# Patient Record
Sex: Male | Born: 1975 | Race: White | Hispanic: No | Marital: Single | Smoking: Never smoker
Health system: Southern US, Community
[De-identification: ages and names within clinical notes are randomized; demographics above are authoritative.]

## PROBLEM LIST (undated history)

## (undated) DIAGNOSIS — I10 Essential (primary) hypertension: Secondary | ICD-10-CM

---

## 2002-04-28 ENCOUNTER — Emergency Department (HOSPITAL_COMMUNITY): Admission: EM | Admit: 2002-04-28 | Discharge: 2002-04-28 | Payer: Self-pay | Admitting: Emergency Medicine

## 2002-04-29 ENCOUNTER — Emergency Department (HOSPITAL_COMMUNITY): Admission: EM | Admit: 2002-04-29 | Discharge: 2002-04-29 | Payer: Self-pay | Admitting: Emergency Medicine

## 2013-07-27 ENCOUNTER — Emergency Department (HOSPITAL_COMMUNITY)
Admission: EM | Admit: 2013-07-27 | Discharge: 2013-07-27 | Disposition: A | Discharge status: Home / Self Care | Payer: Worker's Compensation | Attending: Emergency Medicine | Admitting: Emergency Medicine

## 2013-07-27 ENCOUNTER — Encounter (HOSPITAL_COMMUNITY): Payer: Self-pay | Admitting: Emergency Medicine

## 2013-07-27 DIAGNOSIS — Y990 Civilian activity done for income or pay: Secondary | ICD-10-CM | POA: Insufficient documentation

## 2013-07-27 DIAGNOSIS — Y929 Unspecified place or not applicable: Secondary | ICD-10-CM | POA: Insufficient documentation

## 2013-07-27 DIAGNOSIS — W298XXA Contact with other powered powered hand tools and household machinery, initial encounter: Secondary | ICD-10-CM | POA: Insufficient documentation

## 2013-07-27 DIAGNOSIS — S61209A Unspecified open wound of unspecified finger without damage to nail, initial encounter: Secondary | ICD-10-CM | POA: Insufficient documentation

## 2013-07-27 DIAGNOSIS — Z881 Allergy status to other antibiotic agents status: Secondary | ICD-10-CM | POA: Insufficient documentation

## 2013-07-27 DIAGNOSIS — Z23 Encounter for immunization: Secondary | ICD-10-CM | POA: Insufficient documentation

## 2013-07-27 DIAGNOSIS — S61213A Laceration without foreign body of left middle finger without damage to nail, initial encounter: Secondary | ICD-10-CM

## 2013-07-27 MED ORDER — HYDROCODONE-ACETAMINOPHEN 5-325 MG PO TABS: 1.0000 | ORAL_TABLET | Freq: Four times a day (QID) | ORAL | Status: DC | PRN

## 2013-07-27 MED ORDER — HYDROCODONE-ACETAMINOPHEN 5-325 MG PO TABS
2.0000 | ORAL_TABLET | Freq: Once | ORAL | Status: AC
  Administered 2013-07-27: 2 via ORAL
  Filled 2013-07-27: qty 2

## 2013-07-27 MED ORDER — TETANUS-DIPHTH-ACELL PERTUSSIS 5-2.5-18.5 LF-MCG/0.5 IM SUSP
0.5000 mL | Freq: Once | INTRAMUSCULAR | Status: AC
  Administered 2013-07-27: 0.5 mL via INTRAMUSCULAR
  Filled 2013-07-27: qty 0.5

## 2013-07-27 NOTE — ED Provider Notes (Signed)
CSN: [id number]     Arrival date & time 07/27/13  0700 History   First MD Initiated Contact with Patient 07/27/13 [phone]     Chief Complaint  Patient presents with  . Finger Injury   (Consider location/radiation/quality/duration/timing/severity/associated sxs/prior Treatment) The history is provided by the patient.  pt with laceration/avulsion injury to distal tip of left middle and finger fingers at work from [employer]. Pain constant, dull, moderate, non radiating, worse w palpation. No associated numbness or loss of rom. No weakness. Denies other injury. Right hand dominant. Tetanus unknown.     History reviewed. No pertinent past medical history. History reviewed. No pertinent past surgical history. No family history on file. History  Substance Use Topics  . Smoking status: Never Smoker   . Smokeless tobacco: Not on file  . Alcohol Use: Yes     Comment: socially    Review of Systems  Constitutional: Negative for fever.  Gastrointestinal: Negative for nausea.  Skin: Positive for wound.  Neurological: Negative for numbness.    Allergies  Amoxicillin  Home Medications  No current outpatient prescriptions on file. BP 172/110  Pulse 92  Temp(Src) 97.8 F (36.6 C) (Oral)  Resp 16  SpO2 99% Physical Exam  Nursing note and vitals reviewed. Constitutional: He is oriented to person, place, and time. He appears well-developed and well-nourished. No distress.  HENT:  Head: Atraumatic.  Neck: Neck supple. No tracheal deviation present.  Cardiovascular: Normal rate.   Pulmonary/Chest: Effort normal. No accessory muscle usage. No respiratory distress.  Musculoskeletal: Normal range of motion.  Avulsion injury to distal tip left middle finger, superficial, no bone exposed. Normal rom digit, normal extensor and flexor tendon fxn.  Laterally based superficial flap/avulsion injury, laceration, to distal tip left ring finger. Normal ext and flex tendon fxn. No bone exposed. Nail  intact. No subung hematoma.   Neurological: He is alert and oriented to person, place, and time.  Skin: Skin is warm and dry.  Psychiatric: He has a normal mood and affect.    ED Course  LACERATION REPAIR Date/Time: 07/27/2013 8:10 AM Performed by: Suzi Roots Authorized by: Suzi Roots Consent: Verbal consent obtained. Consent given by: patient Body area: upper extremity Location details: left long finger Laceration length: 1 cm Tendon involvement: none Vascular damage: no Anesthesia: digital block Local anesthetic: lidocaine 2% without epinephrine Anesthetic total: 3 ml Preparation: Patient was prepped and draped in the usual sterile fashion. Irrigation solution: saline Irrigation method: syringe Amount of cleaning: standard Skin closure: 5-0 Prolene Number of sutures: 2 Technique: simple Approximation difficulty: complex Patient tolerance: Patient tolerated the procedure well with no immediate complications.   (including critical care time)  MDM  Tetanus im. Wounds cleaned.   Left ring finger w superficial avulsion injury distal tip, not suturable. Cleaned, sterile dressing.  Left middle finger with superficial avulsion, flap type lac to distal tip w flap of superficial skin being whitish in color, laterally based flap.  There are 3 small, 1 cm, linear lacs, parallel to each other, 1 mm apart from each other.  Discussed w pt that flap of thin skin likely not viable, but will clean thoroughly and suture/close wound.  Pt has ride, does not have to drive. No meds pta.  vicodin po.  Sterile dressing.   Splint to finger to protect lac/injury.         Suzi Roots, MD 07/27/13 [phone]

## 2013-07-27 NOTE — Progress Notes (Signed)
Orthopedic Tech Progress Note Patient Details:  Christopher Villa 07-21-1976 [id number]  Ortho Devices Type of Ortho Device: Finger splint   [name] 07/27/2013, 8:34 AM

## 2013-07-27 NOTE — ED Notes (Signed)
Pt presents to ED with lacerations to left middle finger and left ring finger is missing top layer of skin. Bleeding controlled. Pt reports he was at work using a [employer]" when the "work piece kicked out and my fingers went into the router." Pt able to wiggle fingers but sts "they feel numb."

## 2013-12-05 ENCOUNTER — Emergency Department (HOSPITAL_COMMUNITY)
Admission: EM | Admit: 2013-12-05 | Discharge: 2013-12-05 | Disposition: A | Discharge status: Home / Self Care | Payer: Worker's Compensation | Attending: Emergency Medicine | Admitting: Emergency Medicine

## 2013-12-05 ENCOUNTER — Emergency Department (HOSPITAL_COMMUNITY): Payer: Worker's Compensation

## 2013-12-05 ENCOUNTER — Encounter (HOSPITAL_COMMUNITY): Payer: Self-pay | Admitting: Emergency Medicine

## 2013-12-05 DIAGNOSIS — Y9389 Activity, other specified: Secondary | ICD-10-CM | POA: Insufficient documentation

## 2013-12-05 DIAGNOSIS — Z880 Allergy status to penicillin: Secondary | ICD-10-CM | POA: Insufficient documentation

## 2013-12-05 DIAGNOSIS — W298XXA Contact with other powered powered hand tools and household machinery, initial encounter: Secondary | ICD-10-CM | POA: Insufficient documentation

## 2013-12-05 DIAGNOSIS — Y9289 Other specified places as the place of occurrence of the external cause: Secondary | ICD-10-CM | POA: Insufficient documentation

## 2013-12-05 DIAGNOSIS — S61209A Unspecified open wound of unspecified finger without damage to nail, initial encounter: Secondary | ICD-10-CM | POA: Insufficient documentation

## 2013-12-05 DIAGNOSIS — S61215A Laceration without foreign body of left ring finger without damage to nail, initial encounter: Secondary | ICD-10-CM

## 2013-12-05 MED ORDER — IBUPROFEN 600 MG PO TABS: 600.0000 mg | ORAL_TABLET | Freq: Four times a day (QID) | ORAL | Status: AC | PRN

## 2013-12-05 MED ORDER — OXYCODONE-ACETAMINOPHEN 5-325 MG PO TABS: 1.0000 | ORAL_TABLET | ORAL | Status: DC | PRN

## 2013-12-05 NOTE — ED Provider Notes (Signed)
CSN: [id number]     Arrival date & time 12/05/13  1544 History  This chart was scribed for non-physician practitioner Junius Finner, PA-C working with Dagmar Hait, MD by Valera Castle, ED scribe. This patient was seen in room TR09C/TR09C and the patient's care was started at 5:10 PM.   Chief Complaint  Patient presents with  . Hand Injury   (Consider location/radiation/quality/duration/timing/severity/associated sxs/prior Treatment) The history is provided by the patient. No language interpreter was used.   HPI Comments: Christopher Villa is a 38 y.o. male who presents to the Emergency Department complaining of a laceration over his left ring finger, with associated constant, 1/10 pain, onset earlier this afternoon after accidentally drilling into his finger while drilling into wood. He reports being right hand dominant. He denies numbness, and any other associated symptoms. He reports his tetanus is UTD. He reports an allergy to Amoxicillin, stating he breaks out in hives. He denies any medical history.   PCP - No PCP Per Patient  History reviewed. No pertinent past medical history. History reviewed. No pertinent past surgical history. History reviewed. No pertinent family history. History  Substance Use Topics  . Smoking status: Never Smoker   . Smokeless tobacco: Not on file  . Alcohol Use: Yes     Comment: socially    Review of Systems  Musculoskeletal: Positive for arthralgias (left 4th finger).  Skin: Positive for wound (left 4th finger).  Neurological: Negative for numbness.   Allergies  Amoxicillin  Home Medications   Current Outpatient Rx  Name  Route  Sig  Dispense  Refill  . ibuprofen (ADVIL,MOTRIN) 600 MG tablet   Oral   Take 1 tablet (600 mg total) by mouth every 6 (six) hours as needed.   30 tablet   0   . oxyCODONE-acetaminophen (PERCOCET/ROXICET) 5-325 MG per tablet   Oral   Take 1-2 tablets by mouth every 4 (four) hours as needed for severe  pain.   10 tablet   0    BP 160/97  Pulse 99  Temp(Src) 97.4 F (36.3 C) (Oral)  Resp 16  Ht 6' (1.829 m)  Wt 247 lb (112.038 kg)  BMI 33.49 kg/m2  SpO2 100%  Physical Exam  Nursing note and vitals reviewed. Constitutional: He is oriented to person, place, and time. He appears well-developed and well-nourished.  HENT:  Head: Normocephalic and atraumatic.  Eyes: EOM are normal.  Neck: Normal range of motion.  Cardiovascular: Normal rate and intact distal pulses.   DP intact. Cap refill  < 3 seconds.   Pulmonary/Chest: Effort normal.  Musculoskeletal: Normal range of motion.  Laceration to proximal left ring finger. Radial aspect involving web space between 3rd and 4th finger. Adipose tissue exposed. Jagged edges to laceration. Full ROM at MCP, PIP, and DIP. Bleeding controlled.   Neurological: He is alert and oriented to person, place, and time.  Sensation intact.   Skin: Skin is warm and dry.  Psychiatric: He has a normal mood and affect. His behavior is normal.    ED Course  Procedures (including critical care time)  DIAGNOSTIC STUDIES: Oxygen Saturation is 100% on room air, normal by my interpretation.    COORDINATION OF CARE: 5:14 PM-Discussed treatment plan which includes consulting attending provider for wound repair with pt at bedside and pt agreed to plan.   LACERATION REPAIR PROCEDURE NOTE The patient's identification was confirmed and consent was obtained. This procedure was performed by Junius Finner, PA-C at 5:30 PM. Site: Proximal left  4th digit in web space of 4th and 3rd digit.  Sterile procedures observed: Saline Anesthetic used (type and amt): 2cc 2% Lidocaine without Epinephrine Suture type/size: 4.0 Prolene Length:3cm # of Sutures: 5 Technique: Interrupted Complexity: Complex jagged Tetanus UTD  Patient tolerance: Patient tolerated the procedure well with no immediate complications.    No results found for this or any previous visit. Dg  Finger Ring Left  12/05/2013   CLINICAL DATA:  Left ring finger injury, pain.  EXAM: LEFT RING FINGER 2+V  COMPARISON:  None.  FINDINGS: Skin defect is seen along the proximal aspect of the left ring finger on the radial side consistent laceration. There is no fracture, dislocation or radiopaque foreign body.  IMPRESSION: Laceration without underlying fracture or foreign body.   Electronically Signed   By: Drusilla Kannerhomas  Dalessio M.D.   On: 12/05/2013 17:03    EKG Interpretation None     Medications - No data to display  MDM   Final diagnoses:  Laceration of left ring finger w/o foreign body w/o damage to nail    Pt presenting to ED with laceration of left 4th finger with involvement into webspace between 3rd and 4th digit from drill he was using at work. Tetanus is UTD. No active bleeding. FROM all fingers, neurovascularly in tact.  No other injuries. Plain films: laceration w/o underlying fracture or foreign body. Laceration was repaired w/o immediate complication, see above.  Rx: percocet and ibuprofen. Advised to f/u with medical provided in 10 days for suture removal.  Home care instructions provided. Return precautions provided. Pt verbalized understanding and agreement with tx plan.  Discussed pt with Dr. Gwendolyn GrantWalden who also examined pt and agrees with plan.  Do not believe hand surgery needed at this time.   I personally performed the services described in this documentation, which was scribed in my presence. The recorded information has been reviewed and is accurate.   [name], PA-C 12/06/13 1816

## 2013-12-05 NOTE — ED Notes (Signed)
Applied gauze to wound and buddy taped ring and middle finger together.

## 2013-12-05 NOTE — ED Notes (Signed)
Reports accidentally drilling into left ring finger. Bleeding controlled. Reports tetanus is up to date.

## 2013-12-07 NOTE — ED Provider Notes (Signed)
Medical screening examination/treatment/procedure(s) were conducted as a shared visit with non-physician practitioner(s) and myself.  I personally evaluated the patient during the encounter.   EKG Interpretation None       Patient presents with hand injury from drill. L hand, 3rd and 4th webspace. NVI. Xray normal. Repaired here by Junius FinnerErin O'Malley.  Dagmar HaitWilliam Lakela Kuba, MD 12/07/13 [id number]

## 2014-08-01 IMAGING — CR DG FINGER RING 2+V*L*
3 series · 3 of 3 positions shown · non-contrast
Comparison: None.

CLINICAL DATA: Left ring finger injury, pain.

EXAM:
LEFT RING FINGER 2+V

[x finger pa left]
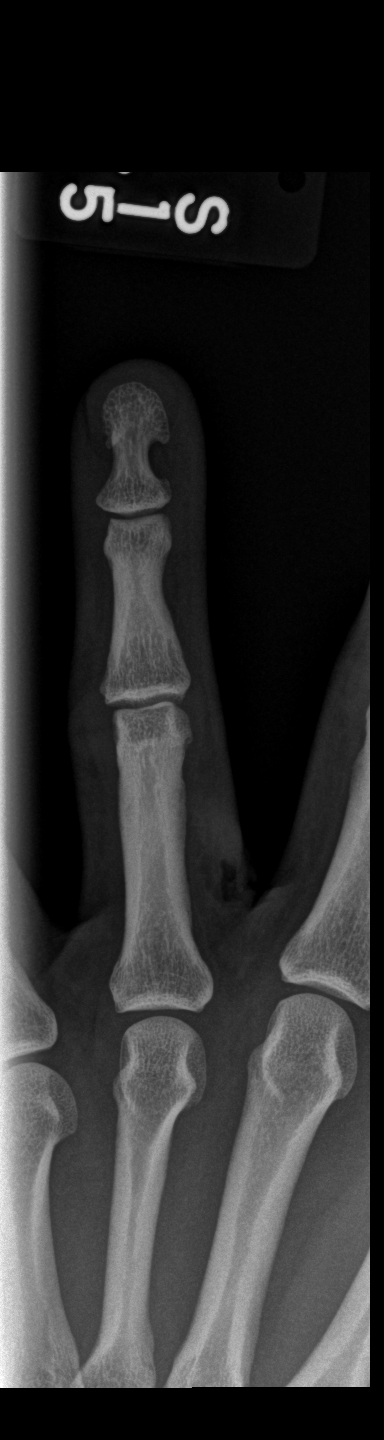

[x finger obl left]
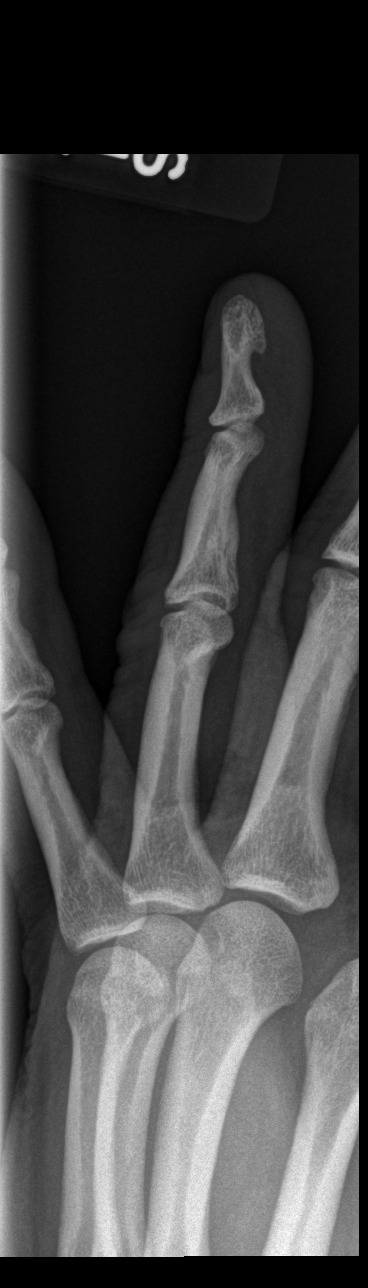

[x finger lat left]
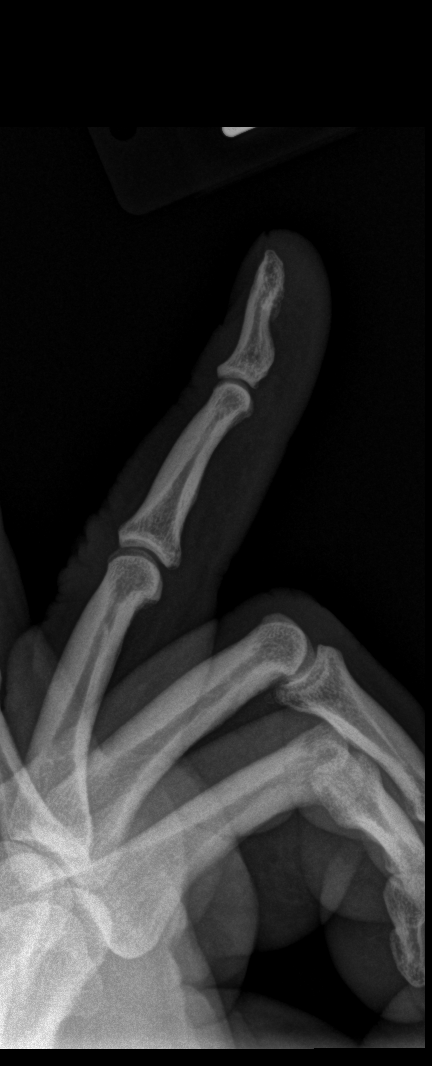

[3 of 3 positions shown; findings below may reference images not displayed]

FINDINGS: Skin defect is seen along the proximal aspect of the left ring
finger on the radial side consistent laceration. There is no
fracture, dislocation or radiopaque foreign body.
IMPRESSION: Laceration without underlying fracture or foreign body.

## 2018-02-17 ENCOUNTER — Ambulatory Visit (HOSPITAL_COMMUNITY)
Admission: EM | Admit: 2018-02-17 | Discharge: 2018-02-17 | Disposition: A | Discharge status: Home / Self Care | Payer: BLUE CROSS/BLUE SHIELD | Attending: Family Medicine | Admitting: Family Medicine

## 2018-02-17 ENCOUNTER — Encounter (HOSPITAL_COMMUNITY): Payer: Self-pay | Admitting: Emergency Medicine

## 2018-02-17 ENCOUNTER — Other Ambulatory Visit: Payer: Self-pay

## 2018-02-17 DIAGNOSIS — I10 Essential (primary) hypertension: Secondary | ICD-10-CM | POA: Diagnosis not present

## 2018-02-17 DIAGNOSIS — M5412 Radiculopathy, cervical region: Secondary | ICD-10-CM

## 2018-02-17 DIAGNOSIS — R51 Headache: Secondary | ICD-10-CM

## 2018-02-17 DIAGNOSIS — M62838 Other muscle spasm: Secondary | ICD-10-CM

## 2018-02-17 DIAGNOSIS — R519 Headache, unspecified: Secondary | ICD-10-CM

## 2018-02-17 LAB — POCT I-STAT, CHEM 8
BUN: 17 mg/dL (ref 6–20)
CREATININE: 1.1 mg/dL (ref 0.61–1.24)
Calcium, Ion: 1.26 mmol/L (ref 1.15–1.40)
Chloride: 101 mmol/L (ref 101–111)
Glucose, Bld: 93 mg/dL (ref 65–99)
HEMATOCRIT: 49 % (ref 39.0–52.0)
Hemoglobin: 16.7 g/dL (ref 13.0–17.0)
POTASSIUM: 3.8 mmol/L (ref 3.5–5.1)
Sodium: 141 mmol/L (ref 135–145)
TCO2: 27 mmol/L (ref 22–32)

## 2018-02-17 MED ORDER — HYDROCHLOROTHIAZIDE 25 MG PO TABS: 25.0000 mg | ORAL_TABLET | Freq: Every day | ORAL | 0 refills | Status: AC

## 2018-02-17 MED ORDER — MELOXICAM 7.5 MG PO TABS: 7.5000 mg | ORAL_TABLET | Freq: Every day | ORAL | 0 refills | Status: AC

## 2018-02-17 MED ORDER — CYCLOBENZAPRINE HCL 5 MG PO TABS: 5.0000 mg | ORAL_TABLET | Freq: Every day | ORAL | 0 refills | Status: AC

## 2018-02-17 NOTE — Discharge Instructions (Signed)
Meloxicam daily may help some with neck pain and headache, don't take additional ibuprofen but you may take tylenol as needed.  May use muscle relaxer at night, may cause drowsiness so do not take if driving, drinking or operating machinery. We will start you on medication for your blood pressure. May check your blood pressure a few times  a week at home.  Please keep your appointment with your primary care provider in two weeks for recheck as may need additional labs and/or medication adjustments.  If symptoms worsen or do not improve in the next week to return to be seen.

## 2018-02-17 NOTE — ED Provider Notes (Signed)
[facility]    CSN: [id number] Arrival date & time: 02/17/18  1409     History   Chief Complaint No chief complaint on file.   HPI Christopher Villa is a 42 y.o. male.   Christopher Villa presents with complaints of feeling "drained" for the past few days as well as intermittent hand/arm tingling sensation. 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 States took his BP at work today and it was 177/115. States he has right neck soreness which seems to cause his headache. Took ibuprofen at 1a this morning which helped some. Denies chest pain . Occasionally feels shortness of breath , no leg pain or swelling. States he has been getting more frequent headaches recently. Made appointment with PCP in two weeks as has not followed with one. Does not smoke. States stopped drinking caffeine cold [place] two days ago. Had been drinking energy drink and soda throughout the day.  Does not take any medications regularly.    ROS per HPI.      History reviewed. No pertinent past medical history.  There are no active problems to display for this patient.   History reviewed. No pertinent surgical history.     Home Medications    Prior to Admission medications   Medication Sig Start Date End Date Taking? Authorizing Provider  cyclobenzaprine (FLEXERIL) 5 MG tablet Take 1 tablet (5 mg total) by mouth at bedtime. 02/17/18   Georgetta Haber, NP  hydrochlorothiazide (HYDRODIURIL) 25 MG tablet Take 1 tablet (25 mg total) by mouth daily. 02/17/18   Georgetta Haber, NP  ibuprofen (ADVIL,MOTRIN) 600 MG tablet Take 1 tablet (600 mg total) by mouth every 6 (six) hours as needed. 12/05/13   Lurene Shadow, PA-C  meloxicam (MOBIC) 7.5 MG tablet Take 1 tablet (7.5 mg total) by mouth daily. 02/17/18   Georgetta Haber, NP    Family History Family History  Problem Relation Age of Onset  . Hypertension Mother   . Diabetes Father     Social History Social History   Tobacco Use  . Smoking  status: Never Smoker  Substance Use Topics  . Alcohol use: Yes    Comment: socially  . Drug use: No     Allergies   Amoxicillin   Review of Systems Review of Systems   Physical Exam Triage Vital Signs ED Triage Vitals  Enc Vitals Group     BP 02/17/18 1435 (!) 167/93     Pulse Rate 02/17/18 1435 76     Resp 02/17/18 1435 20     Temp 02/17/18 1435 98.4 F (36.9 C)     Temp Source 02/17/18 1435 Oral     SpO2 02/17/18 1435 99 %     Weight --      Height --      Head Circumference --      Peak Flow --      Pain Score 02/17/18 1432 2     Pain Loc --      Pain Edu? --      Excl. in GC? --    No data found.  Updated Vital Signs BP (!) 167/93 (BP Location: Left Arm) Comment (BP Location): regular cuff, borderline sizing  Pulse 76   Temp 98.4 F (36.9 C) (Oral)   Resp 20   SpO2 99%   Visual Acuity Right Eye Distance:   Left Eye Distance:   Bilateral Distance:    Right Eye Near:   Left  Eye Near:    Bilateral Near:     Physical Exam  Constitutional: He is oriented to person, place, and time. He appears well-developed and well-nourished.  Neck: Normal range of motion. Muscular tenderness present. No spinous process tenderness present. No neck rigidity. Normal range of motion present.    Cardiovascular: Normal rate and regular rhythm.  Pulmonary/Chest: Effort normal and breath sounds normal.  Musculoskeletal:       Right shoulder: Normal.       Left shoulder: Normal.  Full sensation to upper extremities with full ROm; strong radial pulses; full neck ROM without difficulty but indicates soreness to right neck which radiates up to scalp   Neurological: He is alert and oriented to person, place, and time.  Skin: Skin is warm and dry.     UC Treatments / Results  Labs (all labs ordered are listed, but only abnormal results are displayed) Labs Reviewed  POCT I-STAT, CHEM 8    EKG None  Radiology No results found.  Procedures Procedures (including  critical care time)  Medications Ordered in UC Medications - No data to display  Initial Impression / Assessment and Plan / UC Course  I have reviewed the triage vital signs and the nursing notes.  Pertinent labs & imaging results that were available during my care of the patient were reviewed by me and considered in my medical decision making (see chart for details).     Chem 8 and exam reassuring today. Noted htn today, last documented visit with htn as well. Question radiculopathy as source of intermittent arm symptoms as does have some neck tightness, very physical job. No chest pain or shortness of breath. Meloxicam, flexeril for neck and headache. Started hctz at this time, has appointment to establish care with PCP in two weeks. Encouraged check of bp over the next two weeks as able. Return precautions provided. Patient verbalized understanding and agreeable to plan.  Ambulatory out of clinic without difficulty.   Final Clinical Impressions(s) / UC Diagnoses   Final diagnoses:  Acute nonintractable headache, unspecified headache type  Neck muscle spasm  Cervical radiculopathy  Hypertension, unspecified type     Discharge Instructions     Meloxicam daily may help some with neck pain and headache, don't take additional ibuprofen but you may take tylenol as needed.  May use muscle relaxer at night, may cause drowsiness so do not take if driving, drinking or operating machinery. We will start you on medication for your blood pressure. May check your blood pressure a few times  a week at home.  Please keep your appointment with your primary care provider in two weeks for recheck as may need additional labs and/or medication adjustments.  If symptoms worsen or do not improve in the next week to return to be seen.     ED Prescriptions    Medication Sig Dispense Auth. Provider   meloxicam (MOBIC) 7.5 MG tablet Take 1 tablet (7.5 mg total) by mouth daily. 10 tablet Linus MakoBurky, Dhaval Woo B,  NP   cyclobenzaprine (FLEXERIL) 5 MG tablet Take 1 tablet (5 mg total) by mouth at bedtime. 15 tablet Linus MakoBurky, Ervan Heber B, NP   hydrochlorothiazide (HYDRODIURIL) 25 MG tablet Take 1 tablet (25 mg total) by mouth daily. 20 tablet Georgetta HaberBurky, Sherise Geerdes B, NP     Controlled Substance Prescriptions [place] Controlled Substance Registry consulted? Not Applicable   Georgetta HaberBurky, Isobella Ascher B, NP 02/17/18 1535

## 2018-02-17 NOTE — ED Triage Notes (Signed)
Patient is feeling tired and run down.  Since Sunday, lethargy has been worse.  Checked bp this morning and reports it as 177/115.  Patient describes bilateral arms as feeling "cold".  Not feeling this currently.  Denies chest pain.  Patient has had headache for 2 days not improved with ibuprofen

## 2022-06-18 ENCOUNTER — Encounter (HOSPITAL_BASED_OUTPATIENT_CLINIC_OR_DEPARTMENT_OTHER): Payer: Self-pay | Admitting: *Deleted

## 2022-06-18 ENCOUNTER — Other Ambulatory Visit: Payer: Self-pay

## 2022-06-18 ENCOUNTER — Emergency Department (HOSPITAL_BASED_OUTPATIENT_CLINIC_OR_DEPARTMENT_OTHER)
Admission: EM | Admit: 2022-06-18 | Discharge: 2022-06-18 | Disposition: A | Discharge status: Home / Self Care | Payer: Commercial Managed Care - PPO | Attending: Emergency Medicine | Admitting: Emergency Medicine

## 2022-06-18 ENCOUNTER — Emergency Department (HOSPITAL_BASED_OUTPATIENT_CLINIC_OR_DEPARTMENT_OTHER): Payer: Commercial Managed Care - PPO

## 2022-06-18 DIAGNOSIS — Z79899 Other long term (current) drug therapy: Secondary | ICD-10-CM | POA: Insufficient documentation

## 2022-06-18 DIAGNOSIS — I10 Essential (primary) hypertension: Secondary | ICD-10-CM | POA: Insufficient documentation

## 2022-06-18 DIAGNOSIS — R519 Headache, unspecified: Secondary | ICD-10-CM | POA: Diagnosis present

## 2022-06-18 DIAGNOSIS — R202 Paresthesia of skin: Secondary | ICD-10-CM | POA: Insufficient documentation

## 2022-06-18 DIAGNOSIS — R030 Elevated blood-pressure reading, without diagnosis of hypertension: Secondary | ICD-10-CM

## 2022-06-18 HISTORY — DX: Essential (primary) hypertension: I10

## 2022-06-18 LAB — COMPREHENSIVE METABOLIC PANEL
ALT: 112 U/L — ABNORMAL HIGH (ref 0–44)
AST: 55 U/L — ABNORMAL HIGH (ref 15–41)
Albumin: 4.7 g/dL (ref 3.5–5.0)
Alkaline Phosphatase: 74 U/L (ref 38–126)
Anion gap: 6 (ref 5–15)
BUN: 19 mg/dL (ref 6–20)
CO2: 24 mmol/L (ref 22–32)
Calcium: 9.5 mg/dL (ref 8.9–10.3)
Chloride: 107 mmol/L (ref 98–111)
Creatinine, Ser: 0.97 mg/dL (ref 0.61–1.24)
GFR, Estimated: >60 mL/min (ref >60)
Glucose, Bld: 94 mg/dL (ref 70–99)
Potassium: 4 mmol/L (ref 3.5–5.1)
Sodium: 137 mmol/L (ref 135–145)
Total Bilirubin: 0.7 mg/dL (ref 0.3–1.2)
Total Protein: 8.4 g/dL — ABNORMAL HIGH (ref 6.5–8.1)

## 2022-06-18 LAB — CBC WITH DIFFERENTIAL/PLATELET
Abs Immature Granulocytes: 0.03 10*3/uL (ref 0.00–0.07)
Basophils Absolute: 0.1 10*3/uL (ref 0.0–0.1)
Basophils Relative: 2 %
Eosinophils Absolute: 0.5 10*3/uL (ref 0.0–0.5)
Eosinophils Relative: 8 %
HCT: 49.7 % (ref 39.0–52.0)
Hemoglobin: 17.8 g/dL — ABNORMAL HIGH (ref 13.0–17.0)
Immature Granulocytes: 1 %
Lymphocytes Relative: 39 %
Lymphs Abs: 2.5 10*3/uL (ref 0.7–4.0)
MCH: 31 pg (ref 26.0–34.0)
MCHC: 35.8 g/dL (ref 30.0–36.0)
MCV: 86.6 fL (ref 80.0–100.0)
Monocytes Absolute: 0.6 10*3/uL (ref 0.1–1.0)
Monocytes Relative: 10 %
Neutro Abs: 2.6 10*3/uL (ref 1.7–7.7)
Neutrophils Relative %: 40 %
Platelets: 194 10*3/uL (ref 150–400)
RBC: 5.74 MIL/uL (ref 4.22–5.81)
RDW: 12.3 % (ref 11.5–15.5)
WBC: 6.3 10*3/uL (ref 4.0–10.5)
nRBC: 0 % (ref 0.0–0.2)

## 2022-06-18 LAB — TROPONIN I (HIGH SENSITIVITY): Troponin I (High Sensitivity): 9 ng/L (ref <18)

## 2022-06-18 LAB — MAGNESIUM: Magnesium: 2.2 mg/dL (ref 1.7–2.4)

## 2022-06-18 MED ORDER — LACTATED RINGERS IV BOLUS
1000.0000 mL | Freq: Once | INTRAVENOUS | Status: AC
  Administered 2022-06-18: 1000 mL via INTRAVENOUS

## 2022-06-18 MED ORDER — KETOROLAC TROMETHAMINE 15 MG/ML IJ SOLN
15.0000 mg | Freq: Once | INTRAMUSCULAR | Status: AC
  Administered 2022-06-18: 15 mg via INTRAVENOUS
  Filled 2022-06-18: qty 1

## 2022-06-18 MED ORDER — PROCHLORPERAZINE EDISYLATE 10 MG/2ML IJ SOLN
10.0000 mg | Freq: Once | INTRAMUSCULAR | Status: AC
  Administered 2022-06-18: 10 mg via INTRAVENOUS
  Filled 2022-06-18: qty 2

## 2022-06-18 NOTE — Discharge Instructions (Addendum)
Your work-up today was overall reassuring.  No concerning findings on CT scan of the head, blood work.  As we did discussed your neurological exam was overall normal.  However we cannot rule out a stroke without doing an MRI.  We discussed close follow-up with neurology versus transfer to [facility] to obtain the MRI.  After this discussion you decided you rather follow-up with neurology and if you have any worsening or any strokelike symptoms such as difficulty with speech, balance issues, weakness on one side of your body, facial droop that you will immediately return to the emergency room.   Your blood pressure was also elevated during your emergency room stay.  I recommend you keep a blood pressure diary and follow-up with your primary care provider.    I have given you a referral to neurology.  They will call you to schedule this appointment.  If you do not hear from them in the next couple days please call their clinic to schedule this appointment.  Alternatively you can call to schedule this appointment today.  You were given a migraine cocktail in the emergency room with slight improvement in your headache.  Your blood work showed slight elevation in your liver function test.  He states that this has been present in the past and your primary care provider is aware.  You feel that these are likely a result of the Goody powders.  I do recommend when he follow-up with your primary care provider that you discuss this.  They may want to do an ultrasound of the liver to monitor the liver.

## 2022-06-18 NOTE — ED Triage Notes (Signed)
Left sided headache. Has had numbness in left arm intermittently, has increased more frequently last PM. BP has been elevated, states SBP > 153mmHg. Lisinopril 10mg , Norvasc10mg / baby ASA qday BEFAST/ VAN negative

## 2022-06-18 NOTE — ED Notes (Signed)
ED Provider at bedside. 

## 2022-06-18 NOTE — ED Provider Notes (Signed)
[facility] Provider Note   CSN: [id number] Arrival date & time: 06/18/22  6789     History  Chief Complaint  Patient presents with   Headache    Christopher Villa is a 46 y.o. male.  46 year old male with past medical history significant for hypertension presents today for evaluation of headache, difficulty focusing, paresthesias to left upper extremity for the past week.  He states this started over the weekend with difficulty focusing and headache.  He noticed the paresthesias last night.  He denies any chest pain.  His significant other is at bedside and states he is complaint of shortness of breath occasionally over the past week.  During my exam patient clutched his chest stating that he had a cramp on the right side.  This lasted for about 5 to 10 seconds and then it completely resolved.  He does have history of frequent headaches but states those are primarily on the back of his head radiating down to his neck however this headache is in the front of the head left-sided behind the left eye.  He denies photophobia, blurry vision, dizziness, weakness.  He also states over the weekend when he was having difficulty focusing he also had difficulty with forming sentences.  He found himself repeating what he wanted to say because he could not articulate what he wanted to say the first time around.   The history is provided by the patient. No language interpreter was used.       Home Medications Prior to Admission medications   Medication Sig Start Date End Date Taking? Authorizing Provider  cyclobenzaprine (FLEXERIL) 5 MG tablet Take 1 tablet (5 mg total) by mouth at bedtime. 02/17/18   Zigmund Gottron, NP  hydrochlorothiazide (HYDRODIURIL) 25 MG tablet Take 1 tablet (25 mg total) by mouth daily. 02/17/18   Zigmund Gottron, NP  ibuprofen (ADVIL,MOTRIN) 600 MG tablet Take 1 tablet (600 mg total) by mouth every 6 (six) hours as needed. 12/05/13   Noe Gens, PA-C  meloxicam (MOBIC) 7.5 MG tablet Take 1 tablet (7.5 mg total) by mouth daily. 02/17/18   Zigmund Gottron, NP      Allergies    Amoxicillin    Review of Systems   Review of Systems  Constitutional:  Negative for chills and fever.  Eyes:  Negative for visual disturbance.  Respiratory:  Positive for shortness of breath. Negative for cough.   Cardiovascular:  Negative for chest pain, palpitations and leg swelling.  Gastrointestinal:  Negative for abdominal pain and nausea.  Neurological:  Positive for numbness and headaches. Negative for dizziness, facial asymmetry, weakness and light-headedness.  All other systems reviewed and are negative.   Physical Exam Updated Vital Signs BP (!) 171/106 (BP Location: Right Arm)   Pulse 88   Temp 98 F (36.7 C) (Oral)   Resp 17   Ht 6' (1.829 m)   Wt 108.9 kg   SpO2 98%   BMI 32.55 kg/m  Physical Exam Vitals and nursing note reviewed.  Constitutional:      General: He is not in acute distress.    Appearance: Normal appearance. He is not ill-appearing.  HENT:     Head: Normocephalic and atraumatic.     Nose: Nose normal.  Eyes:     General: No scleral icterus.    Extraocular Movements: Extraocular movements intact.     Conjunctiva/sclera: Conjunctivae normal.     Pupils: Pupils are equal, round, and reactive  to light.  Cardiovascular:     Rate and Rhythm: Normal rate and regular rhythm.     Pulses: Normal pulses.  Pulmonary:     Effort: Pulmonary effort is normal. No respiratory distress.     Breath sounds: Normal breath sounds. No wheezing or rales.  Abdominal:     General: There is no distension.     Palpations: Abdomen is soft.     Tenderness: There is no abdominal tenderness. There is no guarding.  Musculoskeletal:        General: Normal range of motion.     Cervical back: Normal range of motion.  Skin:    General: Skin is warm and dry.  Neurological:     General: No focal deficit present.     Mental Status:  He is alert and oriented to person, place, and time. Mental status is at baseline.     Comments: Cranial nerves III through XII intact.  Full range of motion in bilateral upper and lower extremities with 5/5 strength in extensor and flexor muscle groups.  Sensation intact and symmetrical.  Pupils equal round reactive to light.  Tongue midline.  No facial droop.  Without pronator drift.     ED Results / Procedures / Treatments   Labs (all labs ordered are listed, but only abnormal results are displayed) Labs Reviewed  CBC WITH DIFFERENTIAL/PLATELET  COMPREHENSIVE METABOLIC PANEL  MAGNESIUM  TROPONIN I (HIGH SENSITIVITY)    EKG EKG Interpretation  Date/Time:  Thursday June 18 2022 09:51:45 EDT Ventricular Rate:  80 PR Interval:  139 QRS Duration: 94 QT Interval:  366 QTC Calculation: 423 R Axis:   21 Text Interpretation: Sinus rhythm Probable anteroseptal infarct, old No old tracing to compare Confirmed by Deno Etienne [id number]) on 06/18/2022 10:01:14 AM  Radiology No results found.  Procedures Procedures    Medications Ordered in ED Medications  lactated ringers bolus 1,000 mL (has no administration in time range)  ketorolac (TORADOL) 15 MG/ML injection 15 mg (has no administration in time range)  prochlorperazine (COMPAZINE) injection 10 mg (has no administration in time range)    ED Course/ Medical Decision Making/ A&P                           Medical Decision Making Amount and/or Complexity of Data Reviewed Labs: ordered. Radiology: ordered. ECG/medicine tests: ordered.  Risk Prescription drug management.   Medical Decision Making / ED Course   This patient presents to the ED for concern of headache and paresthesias, this involves an extensive number of treatment options, and is a complaint that carries with it a high risk of complications and morbidity.  The differential diagnosis includes migraine, cluster headache, CVA, ACS  MDM: 46 year old male  presents with above-mentioned symptoms.  Will obtain blood work including ACS work-up, chest x-ray, CT head, EKG.  Neurological exam without focal deficits.  Sensation intact and symmetrical.  Will provide migraine cocktail and fluid bolus.  CBC without leukocytosis.  Hemoglobin of 17.8.  CMP with preserved renal function.  Mild elevated AST and ALT.  Without abdominal pain.  He states he has known history of slight elevation in his LFTs.  Denies alcohol use.  He associates this to his [employer] powder use for his headaches.  He states in the past week he has taken a Goody powder a couple times.  Discussed following up with his PCP regarding this.  Normal alk phos and normal total bilirubin.  Electrolytes  within normal limits.  Initial troponin of 9, given duration of symptoms, normal EKG, and without chest pain doubt ACS.  Given reassuring neurological work-up, normal CT head low suspicion for CVA however an extensive discussion had with patient that given his symptoms we cannot rule out a stroke without doing an MRI.  We had a discussion regarding close follow-up with neurology versus transfer to [facility] for an MRI.  Following this discussion both the patient and his significant other decided that they would prefer to take a referral to neurology and they will return for any worsening symptoms or strokelike symptoms.  Recommended if they do develop the symptoms that they go directly to [facility] or [facility] where there is an MRI available as opposed to coming back here.  We will provide neurology follow-up.  Slight improvement following migraine cocktail.  Patient discussed with attending Dr. Tyrone Nine.  Discussed keeping a blood pressure diary given his elevated blood pressure.  Discussed follow-up with PCP.  Patient voices understanding and is in agreement with plan.   Lab Tests: -I ordered, reviewed, and interpreted labs.   The pertinent results include:   Labs Reviewed  CBC WITH DIFFERENTIAL/PLATELET -  Abnormal; Notable for the following components:      Result Value   Hemoglobin 17.8 (*)    All other components within normal limits  COMPREHENSIVE METABOLIC PANEL - Abnormal; Notable for the following components:   Total Protein 8.4 (*)    AST 55 (*)    ALT 112 (*)    All other components within normal limits  MAGNESIUM  TROPONIN I (HIGH SENSITIVITY)      EKG  EKG Interpretation  Date/Time:  Thursday June 18 2022 09:51:45 EDT Ventricular Rate:  80 PR Interval:  139 QRS Duration: 94 QT Interval:  366 QTC Calculation: 423 R Axis:   21 Text Interpretation: Sinus rhythm Probable anteroseptal infarct, old No old tracing to compare Confirmed by Deno Etienne [phone]) on 06/18/2022 10:01:14 AM         Imaging Studies ordered: I ordered imaging studies including chest x-ray, CT head without contrast I independently visualized and interpreted imaging. I agree with the radiologist interpretation   Medicines ordered and prescription drug management: Meds ordered this encounter  Medications   lactated ringers bolus 1,000 mL   ketorolac (TORADOL) 15 MG/ML injection 15 mg   prochlorperazine (COMPAZINE) injection 10 mg    -I have reviewed the patients home medicines and have made adjustments as needed  Cardiac Monitoring: The patient was maintained on a cardiac monitor.  I personally viewed and interpreted the cardiac monitored which showed an underlying rhythm of: Normal sinus rhythm without arrhythmia  Reevaluation: After the interventions noted above, I reevaluated the patient and found that they have :improved  Co morbidities that complicate the patient evaluation  Past Medical History:  Diagnosis Date   Hypertension       Dispostion: Patient is appropriate for discharge.  Discharged in stable condition.  Return precautions discussed.   Final Clinical Impression(s) / ED Diagnoses Final diagnoses:  Elevated blood pressure reading  Bad headache  Paresthesias     Rx / DC Orders ED Discharge Orders          Ordered    Ambulatory referral to Neurology       Comments: An appointment is requested in approximately: 1 week   06/18/22 1138              Evlyn Courier, [place] 06/18/22 1141  Deno Etienne, DO 06/18/22 1407

## 2022-08-17 ENCOUNTER — Ambulatory Visit: Payer: BLUE CROSS/BLUE SHIELD | Admitting: Neurology

## 2022-08-17 ENCOUNTER — Ambulatory Visit: Payer: Commercial Managed Care - PPO | Admitting: Neurology
